# Patient Record
Sex: Male | Born: 1985 | Race: Black or African American | Hispanic: No | Marital: Married | Smoking: Never smoker
Health system: Southern US, Community
[De-identification: ages and names within clinical notes are randomized; demographics above are authoritative.]

---
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
## 2006-04-01 ENCOUNTER — Emergency Department (HOSPITAL_COMMUNITY): Admission: EM | Admit: 2006-04-01 | Discharge: 2006-04-01 | Payer: Self-pay | Admitting: Emergency Medicine

## 2007-02-17 ENCOUNTER — Emergency Department (HOSPITAL_COMMUNITY): Admission: EM | Admit: 2007-02-17 | Discharge: 2007-02-17 | Payer: Self-pay | Admitting: Family Medicine
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
## 2007-07-01 ENCOUNTER — Emergency Department (HOSPITAL_COMMUNITY): Admission: EM | Admit: 2007-07-01 | Discharge: 2007-07-01 | Payer: Self-pay | Admitting: Family Medicine

## 2007-08-22 ENCOUNTER — Emergency Department (HOSPITAL_COMMUNITY): Admission: EM | Admit: 2007-08-22 | Discharge: 2007-08-22 | Payer: Self-pay | Admitting: Emergency Medicine

## 2009-04-12 ENCOUNTER — Emergency Department (HOSPITAL_COMMUNITY): Admission: EM | Admit: 2009-04-12 | Discharge: 2009-04-12 | Payer: Self-pay | Admitting: Emergency Medicine

## 2010-06-19 ENCOUNTER — Emergency Department (HOSPITAL_COMMUNITY): Admission: EM | Admit: 2010-06-19 | Discharge: 2010-06-19 | Payer: Self-pay | Admitting: Emergency Medicine

## 2010-08-29 ENCOUNTER — Emergency Department (HOSPITAL_COMMUNITY)
Admission: EM | Admit: 2010-08-29 | Discharge: 2010-08-29 | Payer: Self-pay | Source: Home / Self Care | Admitting: Emergency Medicine

## 2011-02-01 ENCOUNTER — Emergency Department (HOSPITAL_COMMUNITY)
Admission: EM | Admit: 2011-02-01 | Discharge: 2011-02-01 | Disposition: A | Discharge status: Home / Self Care | Payer: Self-pay | Attending: Emergency Medicine | Admitting: Emergency Medicine

## 2011-02-01 DIAGNOSIS — IMO0002 Reserved for concepts with insufficient information to code with codable children: Secondary | ICD-10-CM | POA: Insufficient documentation

## 2011-02-01 DIAGNOSIS — S81009A Unspecified open wound, unspecified knee, initial encounter: Secondary | ICD-10-CM | POA: Insufficient documentation

## 2011-02-01 DIAGNOSIS — S91009A Unspecified open wound, unspecified ankle, initial encounter: Secondary | ICD-10-CM | POA: Insufficient documentation

## 2011-02-01 DIAGNOSIS — Y9289 Other specified places as the place of occurrence of the external cause: Secondary | ICD-10-CM | POA: Insufficient documentation

## 2011-02-13 ENCOUNTER — Emergency Department (HOSPITAL_COMMUNITY)
Admission: EM | Admit: 2011-02-13 | Discharge: 2011-02-13 | Disposition: A | Discharge status: Home / Self Care | Payer: Self-pay | Attending: Emergency Medicine | Admitting: Emergency Medicine

## 2011-02-13 DIAGNOSIS — H11419 Vascular abnormalities of conjunctiva, unspecified eye: Secondary | ICD-10-CM | POA: Insufficient documentation

## 2011-02-13 DIAGNOSIS — H5789 Other specified disorders of eye and adnexa: Secondary | ICD-10-CM | POA: Insufficient documentation

## 2011-02-13 DIAGNOSIS — S0510XA Contusion of eyeball and orbital tissues, unspecified eye, initial encounter: Secondary | ICD-10-CM | POA: Insufficient documentation

## 2011-02-13 DIAGNOSIS — IMO0002 Reserved for concepts with insufficient information to code with codable children: Secondary | ICD-10-CM | POA: Insufficient documentation

## 2011-02-13 DIAGNOSIS — S058X9A Other injuries of unspecified eye and orbit, initial encounter: Secondary | ICD-10-CM | POA: Insufficient documentation

## 2011-02-13 DIAGNOSIS — H5710 Ocular pain, unspecified eye: Secondary | ICD-10-CM | POA: Insufficient documentation
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
## 2012-03-27 ENCOUNTER — Emergency Department (HOSPITAL_COMMUNITY)
Admission: EM | Admit: 2012-03-27 | Discharge: 2012-03-27 | Disposition: A | Discharge status: Home / Self Care | Payer: Self-pay | Attending: Emergency Medicine | Admitting: Emergency Medicine

## 2012-03-27 ENCOUNTER — Emergency Department (HOSPITAL_COMMUNITY): Payer: Self-pay

## 2012-03-27 ENCOUNTER — Encounter (HOSPITAL_COMMUNITY): Payer: Self-pay | Admitting: *Deleted

## 2012-03-27 DIAGNOSIS — M79609 Pain in unspecified limb: Secondary | ICD-10-CM | POA: Insufficient documentation

## 2012-03-27 DIAGNOSIS — R609 Edema, unspecified: Secondary | ICD-10-CM | POA: Insufficient documentation

## 2012-03-27 DIAGNOSIS — M25539 Pain in unspecified wrist: Secondary | ICD-10-CM | POA: Insufficient documentation

## 2012-03-27 DIAGNOSIS — M79642 Pain in left hand: Secondary | ICD-10-CM

## 2012-03-27 MED ORDER — HYDROCODONE-ACETAMINOPHEN 5-325 MG PO TABS: 2.0000 | ORAL_TABLET | ORAL | Status: AC | PRN

## 2012-03-27 NOTE — Discharge Instructions (Signed)
Tendinitis  Tendinitis is swelling and inflammation of the tendons. Tendons are band-like tissues that connect muscle to bone. Tendinitis commonly occurs in the:    Shoulders (rotator cuff).   Heels (Achilles tendon).   Elbows (triceps tendon).  CAUSES  Tendinitis is usually caused by overusing the tendon, muscles, and joints involved. When the tissue surrounding a tendon (synovium) becomes inflamed, it is called tenosynovitis. Tendinitis commonly develops in people whose jobs require repetitive motions.  SYMPTOMS   Pain.   Tenderness.   Mild swelling.  DIAGNOSIS  Tendinitis is usually diagnosed by physical exam. Your caregiver may also order X-rays or other imaging tests.  TREATMENT  Your caregiver may recommend certain medicines or exercises for your treatment.  HOME CARE INSTRUCTIONS    Use a sling or splint for as long as directed by your caregiver until the pain decreases.   Put ice on the injured area.   Put ice in a plastic bag.   Place a towel between your skin and the bag.   Leave the ice on for 15 to 20 minutes, 3 to 4 times a day.   Avoid using the limb while the tendon is painful. Perform gentle range of motion exercises only as directed by your caregiver. Stop exercises if pain or discomfort increase, unless directed otherwise by your caregiver.   Only take over-the-counter or prescription medicines for pain, discomfort, or fever as directed by your caregiver.  SEEK MEDICAL CARE IF:    Your pain and swelling increase.   You develop new, unexplained symptoms, especially increased numbness in the hands.  MAKE SURE YOU:    Understand these instructions.   Will watch your condition.   Will get help right away if you are not doing well or get worse.  Document Released: 10/17/2000 Document Revised: 10/09/2011 Document Reviewed: 01/06/2011  [employer] Patient Information 2012 [employer].

## 2012-03-27 NOTE — ED Notes (Signed)
Pt reports right wrist pain starting two days ago. 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 Pt reports pain has improved slightly. Pt reports using heating pad for pain, but not taken medications.  Pt denies tingling or numbness to fingers but reports weaker grip to right hand. Pt reports pain to forearm when flexing wrist upward and pain to wrist when extending wrist. Pt reports decreased ROM with flexion of wrist.  Pt reports pain to be 1/10.

## 2012-03-27 NOTE — ED Provider Notes (Signed)
History     CSN: [id number]  Arrival date & time 03/27/12  1045   First MD Initiated Contact with Patient 03/27/12 1102      Chief Complaint  Patient presents with  . Wrist Pain  . Arm Pain    (Consider location/radiation/quality/duration/timing/severity/associated sxs/prior treatment) Patient is a 26 y.o. male presenting with wrist pain. The history is provided by the patient. No language interpreter was used.  Wrist Pain This is a new problem. The current episode started today. The problem occurs constantly. The problem has been gradually worsening. Associated symptoms include arthralgias and joint swelling. The symptoms are aggravated by nothing. He has tried nothing for the symptoms.  Pt complains of pain in right wrist.  Pt reports he does repetitive lifting at work.  Pt reports when he awoke this morning fingers felt numb.  Pt reports decreased range of motion 3rd and 4th fingers   History reviewed. No pertinent past medical history.  History reviewed. No pertinent past surgical history.  No family history on file.  History  Substance Use Topics  . Smoking status: Never Smoker   . Smokeless tobacco: Not on file  . Alcohol Use: No      Review of Systems  Musculoskeletal: Positive for joint swelling and arthralgias.  All other systems reviewed and are negative.    Allergies  Review of patient's allergies indicates no known allergies.  Home Medications  No current outpatient prescriptions on file.  BP 138/75  Pulse 70  Temp(Src) 98.6 F (37 C) (Oral)  Resp 16  Ht 6' 2.5" (1.892 m)  Wt 220 lb (99.791 kg)  BMI 27.87 kg/m2  SpO2 100%  Physical Exam  Nursing note and vitals reviewed. Constitutional: He is oriented to person, place, and time. He appears well-developed and well-nourished.  HENT:  Head: Normocephalic and atraumatic.  Musculoskeletal: He exhibits edema and tenderness.       Pain with movement of fingers,decreased range of motion.  Ns and nv  intact  Neurological: He is alert and oriented to person, place, and time. He has normal reflexes.  Skin: Skin is warm.  Psychiatric: He has a normal mood and affect.    ED Course  Procedures (including critical care time)  Labs Reviewed - No data to display No results found.   No diagnosis found.    MDM  I counseled pt,  Pt may have slept on arm,  Symptoms may be from repetive use.   I will splint and have pt follow up with Dr. Mina Marble for evaluation.        Lonia Skinner [place], [place] 03/27/12 1306

## 2012-04-04 NOTE — ED Provider Notes (Signed)
Medical screening examination/treatment/procedure(s) were performed by non-physician practitioner and as supervising physician I was immediately available for consultation/collaboration.  Tallin Hart, MD 04/04/12 0030 

## 2019-01-12 ENCOUNTER — Ambulatory Visit (HOSPITAL_COMMUNITY): Payer: Self-pay

## 2022-06-23 ENCOUNTER — Telehealth: Payer: Self-pay | Admitting: Registered Nurse

## 2022-06-23 ENCOUNTER — Encounter: Payer: Self-pay | Admitting: Registered Nurse

## 2022-07-25 NOTE — Telephone Encounter (Signed)
Noted by HR patient returned to work as expected.  Patient terminated employment resigned 07/25/22 per HR staff no longer eligible for care [facility].

## 2023-08-05 ENCOUNTER — Ambulatory Visit
Admission: EM | Admit: 2023-08-05 | Discharge: 2023-08-05 | Disposition: A | Discharge status: Home / Self Care | Payer: 59 | Attending: Physician Assistant | Admitting: Physician Assistant

## 2023-08-05 DIAGNOSIS — M542 Cervicalgia: Secondary | ICD-10-CM | POA: Diagnosis not present

## 2023-08-05 MED ORDER — PREDNISONE 20 MG PO TABS: 40.0000 mg | ORAL_TABLET | Freq: Every day | ORAL | 0 refills | Status: AC

## 2023-08-05 MED ORDER — CYCLOBENZAPRINE HCL 10 MG PO TABS: 10.0000 mg | ORAL_TABLET | Freq: Two times a day (BID) | ORAL | 0 refills | Status: AC | PRN
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
